# Patient Record
Sex: Female | Born: 1980 | Race: White | Hispanic: No | Marital: Married | Smoking: Former smoker
Health system: Southern US, Community
[De-identification: ages and names within clinical notes are randomized; demographics above are authoritative.]

## PROBLEM LIST (undated history)

## (undated) DIAGNOSIS — F419 Anxiety disorder, unspecified: Secondary | ICD-10-CM

## (undated) DIAGNOSIS — I10 Essential (primary) hypertension: Secondary | ICD-10-CM

## (undated) HISTORY — PX: COLON SURGERY: SHX602

## (undated) HISTORY — PX: HERNIA REPAIR: SHX51

## (undated) HISTORY — PX: CHOLECYSTECTOMY: SHX55

---

## 2004-03-09 ENCOUNTER — Emergency Department (HOSPITAL_COMMUNITY): Admission: EM | Admit: 2004-03-09 | Discharge: 2004-03-09 | Payer: Self-pay | Admitting: Emergency Medicine

## 2006-07-02 IMAGING — CR DG ABDOMEN ACUTE W/ 1V CHEST
4 series · 4 of 4 positions shown · non-contrast
Comparison: none

CLINICAL DATA: Patient has abdominal pain.  
 ABDOMINAL SERIES WITH CHEST
 Chest film reveals the heart size to be normal.  Lung fields are clear.  Supine and erect views of the abdomen reveal retained gas and feces throughout the colon with a few loops of small bowel gas.  Metallic clips in the right upper quadrant with a metallic IUD in the pelvis.  
 IMPRESSION
 Mild ileus with retained feces throughout the colon.

[view not recorded (1 of 4)]
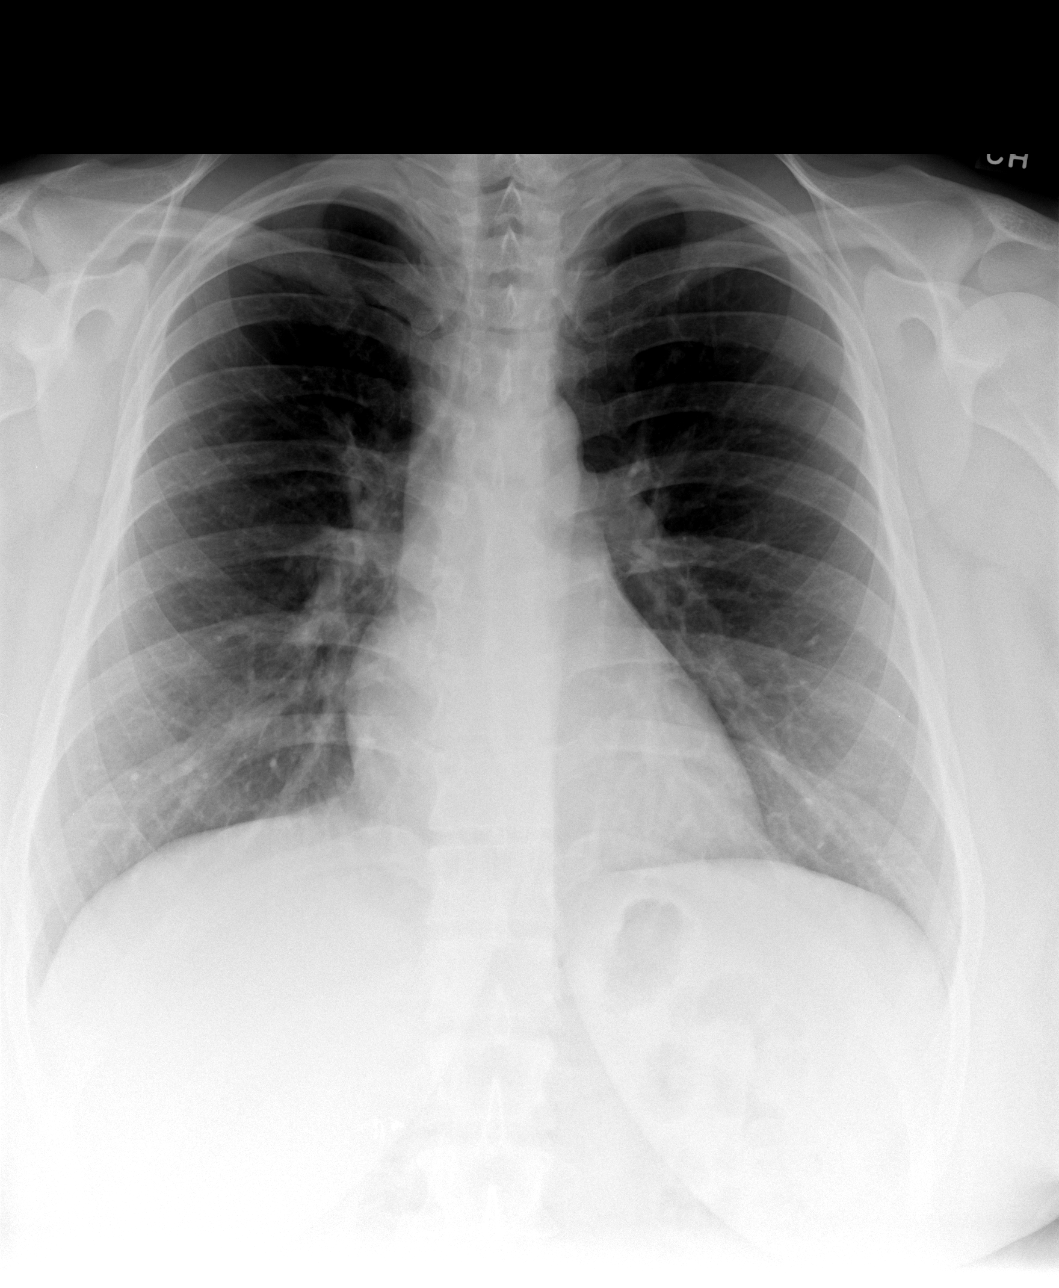

[view not recorded (2 of 4)]
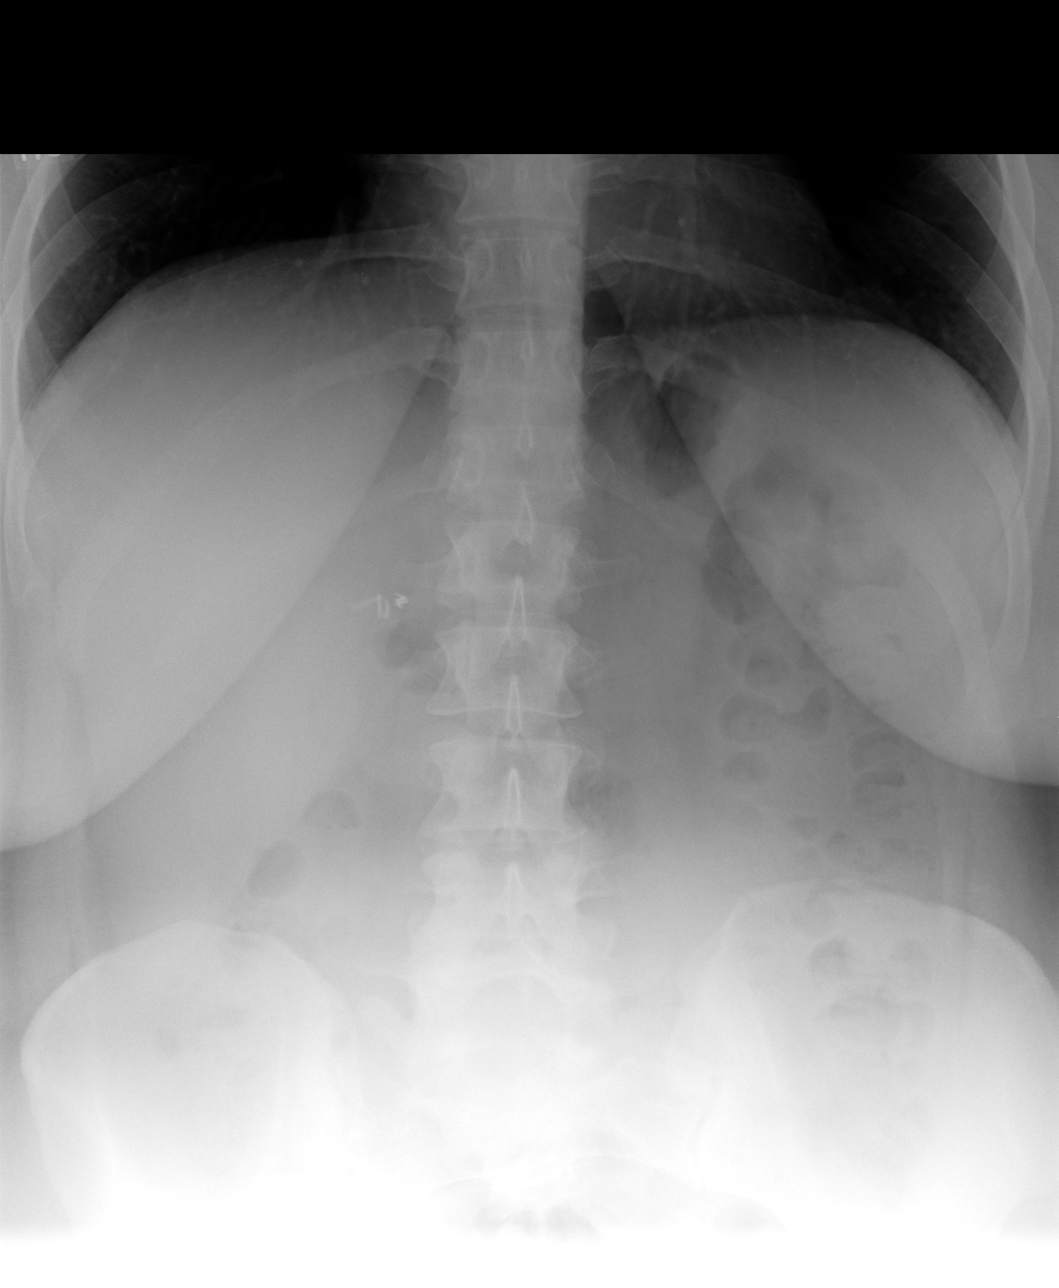

[view not recorded (3 of 4)]
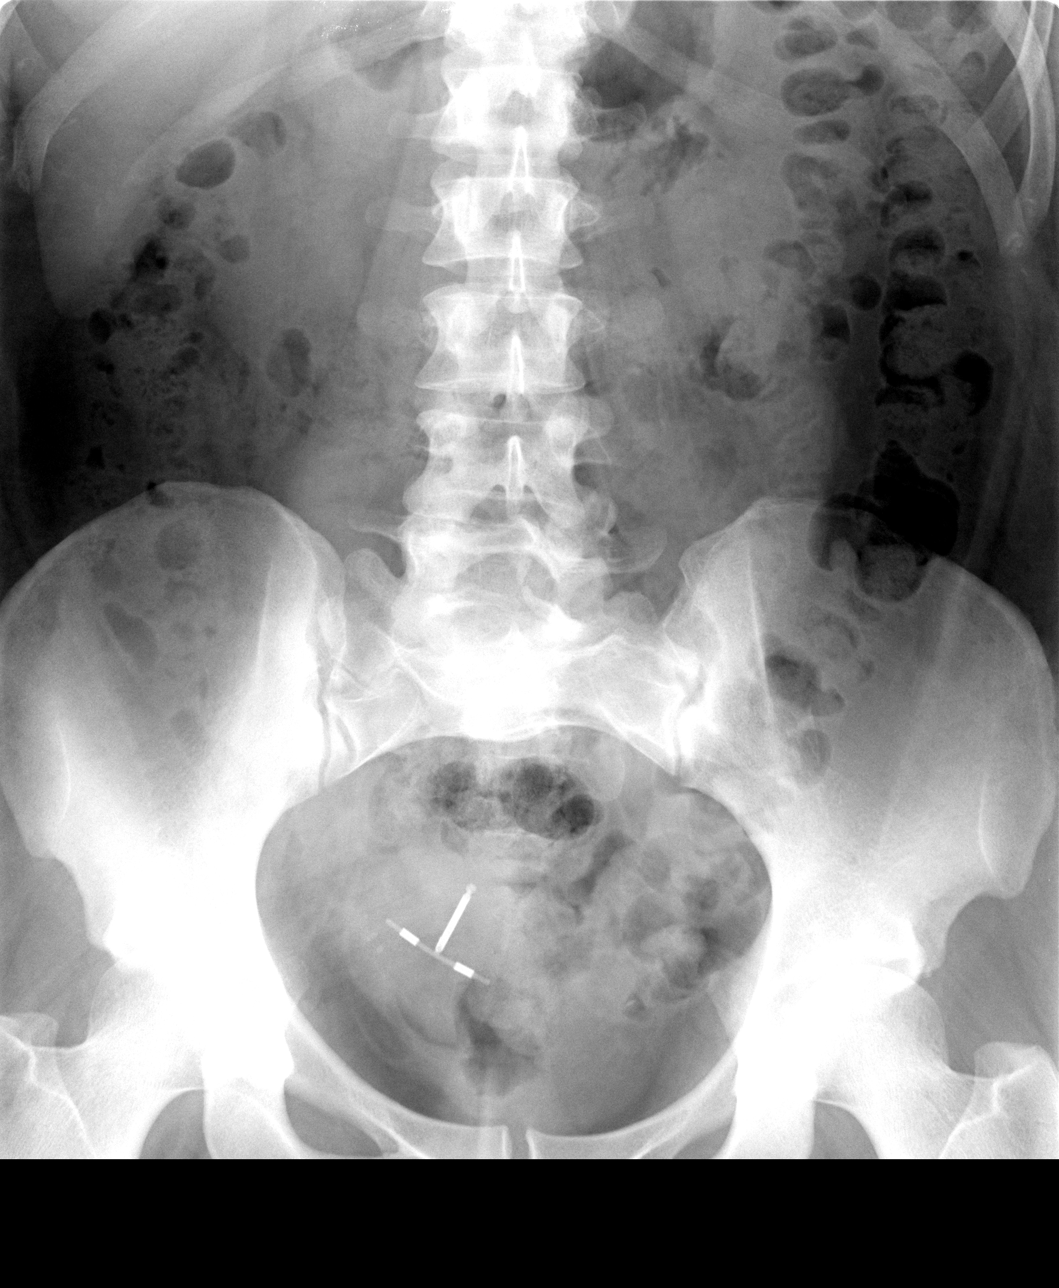

[view not recorded (4 of 4)]
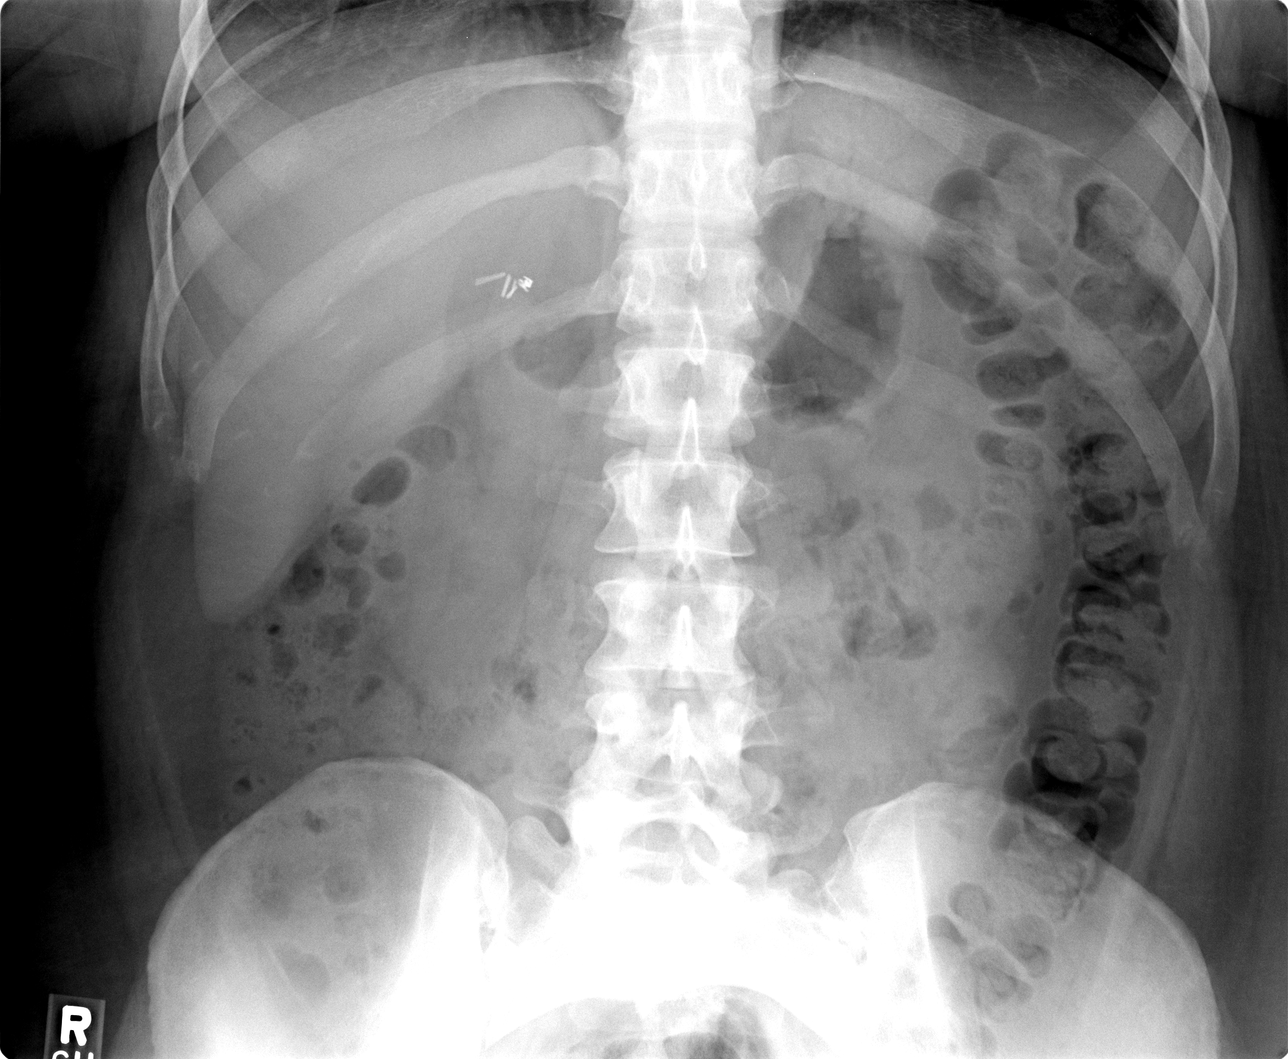

[4 of 4 positions shown; findings below may reference images not displayed]

## 2016-12-26 ENCOUNTER — Emergency Department (HOSPITAL_BASED_OUTPATIENT_CLINIC_OR_DEPARTMENT_OTHER)
Admission: EM | Admit: 2016-12-26 | Discharge: 2016-12-26 | Disposition: A | Discharge status: Home / Self Care | Payer: Self-pay | Attending: Emergency Medicine | Admitting: Emergency Medicine

## 2016-12-26 ENCOUNTER — Encounter (HOSPITAL_BASED_OUTPATIENT_CLINIC_OR_DEPARTMENT_OTHER): Payer: Self-pay

## 2016-12-26 DIAGNOSIS — Z79899 Other long term (current) drug therapy: Secondary | ICD-10-CM | POA: Insufficient documentation

## 2016-12-26 DIAGNOSIS — K625 Hemorrhage of anus and rectum: Secondary | ICD-10-CM | POA: Insufficient documentation

## 2016-12-26 DIAGNOSIS — Z87891 Personal history of nicotine dependence: Secondary | ICD-10-CM | POA: Insufficient documentation

## 2016-12-26 DIAGNOSIS — I10 Essential (primary) hypertension: Secondary | ICD-10-CM | POA: Insufficient documentation

## 2016-12-26 HISTORY — DX: Anxiety disorder, unspecified: F41.9

## 2016-12-26 HISTORY — DX: Essential (primary) hypertension: I10

## 2016-12-26 LAB — BASIC METABOLIC PANEL
Anion gap: 10 (ref 5–15)
BUN: 17 mg/dL (ref 6–20)
CO2: 22 mmol/L (ref 22–32)
Calcium: 9.2 mg/dL (ref 8.9–10.3)
Chloride: 105 mmol/L (ref 101–111)
Creatinine, Ser: 0.95 mg/dL (ref 0.44–1.00)
GFR calc Af Amer: >60 mL/min (ref >60)
Glucose, Bld: 100 mg/dL — ABNORMAL HIGH (ref 65–99)
POTASSIUM: 3.7 mmol/L (ref 3.5–5.1)
SODIUM: 137 mmol/L (ref 135–145)

## 2016-12-26 LAB — URINALYSIS, ROUTINE W REFLEX MICROSCOPIC
Bilirubin Urine: NEGATIVE
Glucose, UA: NEGATIVE mg/dL
Hgb urine dipstick: NEGATIVE
Ketones, ur: NEGATIVE mg/dL
Nitrite: NEGATIVE
Protein, ur: NEGATIVE mg/dL
Specific Gravity, Urine: 1.008 (ref 1.005–1.030)
pH: 5.5 (ref 5.0–8.0)

## 2016-12-26 LAB — CBC
HCT: 34.2 % — ABNORMAL LOW (ref 36.0–46.0)
HEMOGLOBIN: 11.3 g/dL — AB (ref 12.0–15.0)
MCH: 27.9 pg (ref 26.0–34.0)
MCHC: 33 g/dL (ref 30.0–36.0)
MCV: 84.4 fL (ref 78.0–100.0)
Platelets: 305 10*3/uL (ref 150–400)
RBC: 4.05 MIL/uL (ref 3.87–5.11)
RDW: 14.6 % (ref 11.5–15.5)
WBC: 7.8 10*3/uL (ref 4.0–10.5)

## 2016-12-26 LAB — URINALYSIS, MICROSCOPIC (REFLEX): RBC / HPF: NONE SEEN RBC/hpf (ref 0–5)

## 2016-12-26 LAB — OCCULT BLOOD X 1 CARD TO LAB, STOOL: Fecal Occult Bld: NEGATIVE

## 2016-12-26 NOTE — ED Triage Notes (Signed)
C/o bleeding after BM x 1 week-blood on tissue and in stool-NAD-steady gait

## 2016-12-26 NOTE — Discharge Instructions (Signed)
Make sure to stay well-hydrated with water. Follow-up with the stomach doctor (gastroenterologist) for further evaluation of the bleeding. Return to the emergency room if you develop fever, chills, significant pain, vomiting, or any new or worsening symptoms.

## 2016-12-26 NOTE — ED Provider Notes (Signed)
[facility] Provider Note   CSN: [id number] Arrival date & time: 12/26/16  1545     History   Chief Complaint Chief Complaint  Patient presents with  . Rectal Bleeding    HPI Sabrina Watkins is a 36 y.o. female presenting with one-week rectal bleeding.  Patient states that about a week ago, she started to notice blood streaked around her stool and on the toilet paper when she wipes. This has been happening with every bowel movement for the past week. She does not have blood in her underwear in between BMs. She denies fever, chills, nausea, vomiting, abdominal pain, weakness, pallor, or urinary symptoms. She denies dizziness, lightheadedness, or weakness. She denies numbness or tingling. She denies pain with bowel movements. She does not have a history of rectal bleeding in the past. She has had multiple abdominal surgeries including hernia repair last summer which resulted in partial small intestine resection. She has no change in appetite, and has not changed her diet recently. She has not had recent travel. No nausea is having GI symptoms. She has had no change in medicines recently.   HPI  Past Medical History:  Diagnosis Date  . Anxiety   . Hypertension     There are no active problems to display for this patient.   Past Surgical History:  Procedure Laterality Date  . CHOLECYSTECTOMY    . COLON SURGERY    . HERNIA REPAIR      OB History    No data available       Home Medications    Prior to Admission medications   Medication Sig Start Date End Date Taking? Authorizing Provider  ALPRAZolam (XANAX) 0.25 MG tablet Take 0.25 mg by mouth 2 (two) times daily as needed for anxiety.   Yes [provider]  lisinopril-hydrochlorothiazide (PRINZIDE,ZESTORETIC) 20-25 MG tablet Take 1 tablet by mouth daily.   Yes [provider]  verapamil (VERELAN PM) 240 MG 24 hr capsule Take 240 mg by mouth 2 (two) times daily.   Yes [provider]      Family History No family history on file.  Social History Social History  Substance Use Topics  . Smoking status: Former Games developer  . Smokeless tobacco: Never Used  . Alcohol use No     Allergies   Ivp dye [iodinated diagnostic agents] and Sulfa antibiotics   Review of Systems Review of Systems  Constitutional: Negative for chills and fever.  HENT: Negative for congestion and sore throat.   Eyes: Negative for photophobia and pain.  Respiratory: Negative for cough, chest tightness and shortness of breath.   Cardiovascular: Negative for chest pain.  Gastrointestinal: Positive for blood in stool. Negative for abdominal distention, abdominal pain, constipation, diarrhea, nausea, rectal pain and vomiting.  Genitourinary: Negative for dysuria, frequency, hematuria and pelvic pain.  Musculoskeletal: Negative for arthralgias and gait problem.  Skin: Negative for wound.  Neurological: Negative for dizziness, light-headedness and headaches.  Hematological: Does not bruise/bleed easily.  Psychiatric/Behavioral: Negative for confusion.     Physical Exam Updated Vital Signs BP (!) 112/57 (BP Location: Left Arm)   Pulse 97   Temp 99.6 F (37.6 C) (Oral)   Resp 20   Ht 5\' 2"  (1.575 m)   Wt 133 kg (293 lb 3.4 oz)   LMP 12/05/2016   SpO2 98%   BMI 53.63 kg/m   Physical Exam  Constitutional: She is oriented to person, place, and time. She appears well-developed and well-nourished. No distress.  HENT:  Head: Normocephalic and atraumatic.  Mouth/Throat: Mucous membranes are normal.  Eyes: Pupils are equal, round, and reactive to light. Conjunctivae and EOM are normal.  Neck: Normal range of motion.  Cardiovascular: Normal rate, regular rhythm and intact distal pulses.   Pulmonary/Chest: Effort normal and breath sounds normal. No respiratory distress. She has no wheezes.  Abdominal: Soft. Normal appearance and bowel sounds are normal. She exhibits no distension (obese, no  increased distension). There is no tenderness. There is no rigidity, no rebound, no guarding, no CVA tenderness, no tenderness at McBurney's point and negative Murphy's sign.  Genitourinary: Rectum normal. Rectal exam shows no external hemorrhoid, no internal hemorrhoid, no fissure, no mass, anal tone normal and guaiac negative stool.  Genitourinary Comments: No obvious masses or hemorrhoids. No grossly obvious blood.   Musculoskeletal: Normal range of motion.  Lymphadenopathy:    She has no cervical adenopathy.  Neurological: She is alert and oriented to person, place, and time.  Skin: Skin is warm and dry.  Nursing note and vitals reviewed.    ED Treatments / Results  Labs (all labs ordered are listed, but only abnormal results are displayed) Labs Reviewed  CBC - Abnormal; Notable for the following:       Result Value   Hemoglobin 11.3 (*)    HCT 34.2 (*)    All other components within normal limits  BASIC METABOLIC PANEL - Abnormal; Notable for the following:    Glucose, Bld 100 (*)    All other components within normal limits  URINALYSIS, ROUTINE W REFLEX MICROSCOPIC - Abnormal; Notable for the following:    Leukocytes, UA TRACE (*)    All other components within normal limits  URINALYSIS, MICROSCOPIC (REFLEX) - Abnormal; Notable for the following:    Bacteria, UA RARE (*)    Squamous Epithelial / LPF 0-5 (*)    All other components within normal limits  OCCULT BLOOD X 1 CARD TO LAB, STOOL    EKG  EKG Interpretation None       Radiology No results found.  Procedures Procedures (including critical care time)  Medications Ordered in ED Medications - No data to display   Initial Impression / Assessment and Plan / ED Course  I have reviewed the triage vital signs and the nursing notes.  Pertinent labs & imaging results that were available during my care of the patient were reviewed by me and considered in my medical decision making (see chart for details).      Pt presenting with 1 wk h/o blood in her stools. No pain, fevers, chills or blood in her underwear. Physical exam reassuring. DRE showed no obvious deformities or gross blood. Will order basic labs to check hgb and hct, wbc, and electrolytes. Hemoccult and UA ordered.  Labs reassuring. UA negative for infection, hemoccult negative. Doubt intraabdominal infection, perforation, obstruction, or concerning GI bleed. At this time, I do not think she needs abd imaging. Discussed case with attending, and Dr. Ranae Palms agrees to plan. Will have pt f/u with GI. Pt appears safe for discharge. Return precautions given. Pt states she understands and agrees to plan.   Final Clinical Impressions(s) / ED Diagnoses   Final diagnoses:  Rectal bleeding    New Prescriptions Discharge Medication List as of 12/26/2016  5:14 PM       Alveria Apley, PA-C 12/27/16 2346    Loren Racer, MD 12/28/16 1539

## 2023-01-13 ENCOUNTER — Other Ambulatory Visit: Payer: Self-pay

## 2023-01-13 ENCOUNTER — Encounter (HOSPITAL_BASED_OUTPATIENT_CLINIC_OR_DEPARTMENT_OTHER): Payer: Self-pay

## 2023-01-13 ENCOUNTER — Emergency Department (HOSPITAL_BASED_OUTPATIENT_CLINIC_OR_DEPARTMENT_OTHER)
Admission: EM | Admit: 2023-01-13 | Discharge: 2023-01-13 | Disposition: A | Discharge status: Other Acute Inpatient Hospital | Payer: BC Managed Care – PPO | Attending: Emergency Medicine | Admitting: Emergency Medicine

## 2023-01-13 ENCOUNTER — Emergency Department (HOSPITAL_BASED_OUTPATIENT_CLINIC_OR_DEPARTMENT_OTHER): Payer: BC Managed Care – PPO

## 2023-01-13 DIAGNOSIS — E876 Hypokalemia: Secondary | ICD-10-CM

## 2023-01-13 DIAGNOSIS — K562 Volvulus: Secondary | ICD-10-CM | POA: Diagnosis not present

## 2023-01-13 DIAGNOSIS — R509 Fever, unspecified: Secondary | ICD-10-CM | POA: Diagnosis present

## 2023-01-13 DIAGNOSIS — E860 Dehydration: Secondary | ICD-10-CM

## 2023-01-13 DIAGNOSIS — Z1152 Encounter for screening for COVID-19: Secondary | ICD-10-CM | POA: Diagnosis not present

## 2023-01-13 DIAGNOSIS — I10 Essential (primary) hypertension: Secondary | ICD-10-CM | POA: Insufficient documentation

## 2023-01-13 DIAGNOSIS — Z79899 Other long term (current) drug therapy: Secondary | ICD-10-CM | POA: Diagnosis not present

## 2023-01-13 LAB — MAGNESIUM: Magnesium: 1.7 mg/dL (ref 1.7–2.4)

## 2023-01-13 LAB — URINALYSIS, MICROSCOPIC (REFLEX)

## 2023-01-13 LAB — CBC WITH DIFFERENTIAL/PLATELET
Abs Immature Granulocytes: 0.06 10*3/uL (ref 0.00–0.07)
Basophils Absolute: 0 10*3/uL (ref 0.0–0.1)
Basophils Relative: 0 %
Eosinophils Absolute: 0 10*3/uL (ref 0.0–0.5)
Eosinophils Relative: 0 %
HCT: 36.8 % (ref 36.0–46.0)
Hemoglobin: 12.4 g/dL (ref 12.0–15.0)
Immature Granulocytes: 1 %
Lymphocytes Relative: 11 %
Lymphs Abs: 1.2 10*3/uL (ref 0.7–4.0)
MCH: 28 pg (ref 26.0–34.0)
MCHC: 33.7 g/dL (ref 30.0–36.0)
MCV: 83.1 fL (ref 80.0–100.0)
Monocytes Absolute: 1.2 10*3/uL — ABNORMAL HIGH (ref 0.1–1.0)
Monocytes Relative: 12 %
Neutro Abs: 8.2 10*3/uL — ABNORMAL HIGH (ref 1.7–7.7)
Neutrophils Relative %: 76 %
Platelets: 268 10*3/uL (ref 150–400)
RBC: 4.43 MIL/uL (ref 3.87–5.11)
RDW: 13.9 % (ref 11.5–15.5)
WBC: 10.8 10*3/uL — ABNORMAL HIGH (ref 4.0–10.5)
nRBC: 0 % (ref 0.0–0.2)

## 2023-01-13 LAB — COMPREHENSIVE METABOLIC PANEL
ALT: 30 U/L (ref 0–44)
AST: 32 U/L (ref 15–41)
Albumin: 3.1 g/dL — ABNORMAL LOW (ref 3.5–5.0)
Alkaline Phosphatase: 71 U/L (ref 38–126)
Anion gap: 10 (ref 5–15)
BUN: 10 mg/dL (ref 6–20)
CO2: 24 mmol/L (ref 22–32)
Calcium: 8.3 mg/dL — ABNORMAL LOW (ref 8.9–10.3)
Chloride: 99 mmol/L (ref 98–111)
Creatinine, Ser: 0.77 mg/dL (ref 0.44–1.00)
GFR, Estimated: >60 mL/min (ref >60)
Glucose, Bld: 103 mg/dL — ABNORMAL HIGH (ref 70–99)
Potassium: 2.6 mmol/L — CL (ref 3.5–5.1)
Sodium: 133 mmol/L — ABNORMAL LOW (ref 135–145)
Total Bilirubin: 0.5 mg/dL (ref 0.3–1.2)
Total Protein: 6.8 g/dL (ref 6.5–8.1)

## 2023-01-13 LAB — URINALYSIS, ROUTINE W REFLEX MICROSCOPIC
Glucose, UA: NEGATIVE mg/dL
Ketones, ur: 15 mg/dL — AB
Nitrite: NEGATIVE
Protein, ur: 100 mg/dL — AB
Specific Gravity, Urine: >=1.03 (ref 1.005–1.030)
pH: 6 (ref 5.0–8.0)

## 2023-01-13 LAB — LIPASE, BLOOD: Lipase: 25 U/L (ref 11–51)

## 2023-01-13 LAB — PREGNANCY, URINE: Preg Test, Ur: NEGATIVE

## 2023-01-13 LAB — LACTIC ACID, PLASMA: Lactic Acid, Venous: 0.8 mmol/L (ref 0.5–1.9)

## 2023-01-13 LAB — SARS CORONAVIRUS 2 BY RT PCR: SARS Coronavirus 2 by RT PCR: NEGATIVE

## 2023-01-13 MED ORDER — ONDANSETRON HCL 4 MG/2ML IJ SOLN
4.0000 mg | Freq: Once | INTRAMUSCULAR | Status: AC
  Administered 2023-01-13: 4 mg via INTRAVENOUS
  Filled 2023-01-13: qty 2

## 2023-01-13 MED ORDER — SODIUM CHLORIDE 0.9 % IV SOLN: INTRAVENOUS | Status: DC

## 2023-01-13 MED ORDER — POTASSIUM CHLORIDE 10 MEQ/100ML IV SOLN
10.0000 meq | INTRAVENOUS | Status: AC
  Administered 2023-01-13 (×2): 10 meq via INTRAVENOUS
  Filled 2023-01-13 (×2): qty 100

## 2023-01-13 MED ORDER — SODIUM CHLORIDE 0.9 % IV BOLUS
1000.0000 mL | Freq: Once | INTRAVENOUS | Status: AC
  Administered 2023-01-13: 1000 mL via INTRAVENOUS

## 2023-01-13 MED ORDER — PIPERACILLIN-TAZOBACTAM 3.375 G IVPB 30 MIN
3.3750 g | Freq: Once | INTRAVENOUS | Status: AC
  Administered 2023-01-13: 3.375 g via INTRAVENOUS
  Filled 2023-01-13: qty 50

## 2023-01-13 MED ORDER — ACETAMINOPHEN 650 MG RE SUPP
650.0000 mg | Freq: Once | RECTAL | Status: AC
  Administered 2023-01-13: 650 mg via RECTAL
  Filled 2023-01-13: qty 1

## 2023-01-13 NOTE — ED Notes (Signed)
Attempted to call report x2, both times rang through to voicemail. Will attempt later, will give bedside report to CCT.

## 2023-01-13 NOTE — ED Provider Notes (Signed)
[place] [facility] AT [facility] Provider Note   CSN: [id number] Arrival date & time: 01/13/23  4098     History  Chief Complaint  Patient presents with   Fever    Sabrina Watkins is a 42 y.o. female.  Pt is a 42 yo female with pmhx significant for htn and anxiety.  Pt has had diarrhea and fever for the past few days.  She also has upper abd pain.  No vomiting.  No nausea.  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  She is worried she has this again.       Home Medications Prior to Admission medications   Medication Sig Start Date End Date Taking? Authorizing Provider  ALPRAZolam (XANAX) 0.25 MG tablet Take 0.25 mg by mouth 2 (two) times daily as needed for anxiety.    [provider]  lisinopril-hydrochlorothiazide (PRINZIDE,ZESTORETIC) 20-25 MG tablet Take 1 tablet by mouth daily.    [provider]  verapamil (VERELAN PM) 240 MG 24 hr capsule Take 240 mg by mouth 2 (two) times daily.    [provider]      Allergies    Ivp dye [iodinated contrast media] and Sulfa antibiotics    Review of Systems   Review of Systems  Gastrointestinal:  Positive for abdominal pain and nausea.  All other systems reviewed and are negative.   Physical Exam Updated Vital Signs BP (!) 114/52 (BP Location: Right Arm)   Pulse 88   Temp (!) 101.8 F (38.8 C) (Oral)   Resp 17   Ht 5\' 3"  (1.6 m)   Wt 133.8 kg   SpO2 97%   BMI 52.26 kg/m  Physical Exam Vitals and nursing note reviewed.  Constitutional:      Appearance: Normal appearance. She is obese.  HENT:     Head: Normocephalic and atraumatic.     Right Ear: External ear normal.     Left Ear: External ear normal.     Nose: Nose normal.     Mouth/Throat:     Mouth: Mucous membranes are dry.  Eyes:     Extraocular Movements: Extraocular movements intact.     Conjunctiva/sclera: Conjunctivae normal.     Pupils: Pupils are equal,  round, and reactive to light.  Cardiovascular:     Rate and Rhythm: Regular rhythm. Tachycardia present.     Pulses: Normal pulses.     Heart sounds: Normal heart sounds.  Pulmonary:     Effort: Pulmonary effort is normal.     Breath sounds: Normal breath sounds.  Abdominal:     General: Abdomen is flat. Bowel sounds are normal.     Palpations: Abdomen is soft.     Tenderness: There is generalized abdominal tenderness.  Musculoskeletal:        General: Normal range of motion.     Cervical back: Normal range of motion and neck supple.  Skin:    General: Skin is warm.     Capillary Refill: Capillary refill takes less than 2 seconds.  Neurological:     General: No focal deficit present.     Mental Status: She is alert and oriented to person, place, and time.  Psychiatric:        Mood and Affect: Mood normal.        Behavior: Behavior normal.     ED Results / Procedures / Treatments   Labs (all labs ordered are listed, but only abnormal  results are displayed) Labs Reviewed  CBC WITH DIFFERENTIAL/PLATELET - Abnormal; Notable for the following components:      Result Value   WBC 10.8 (*)    Neutro Abs 8.2 (*)    Monocytes Absolute 1.2 (*)    All other components within normal limits  COMPREHENSIVE METABOLIC PANEL - Abnormal; Notable for the following components:   Sodium 133 (*)    Potassium 2.6 (*)    Glucose, Bld 103 (*)    Calcium 8.3 (*)    Albumin 3.1 (*)    All other components within normal limits  URINALYSIS, ROUTINE W REFLEX MICROSCOPIC - Abnormal; Notable for the following components:   Color, Urine AMBER (*)    APPearance HAZY (*)    Hgb urine dipstick TRACE (*)    Bilirubin Urine MODERATE (*)    Ketones, ur 15 (*)    Protein, ur 100 (*)    Leukocytes,Ua TRACE (*)    All other components within normal limits  URINALYSIS, MICROSCOPIC (REFLEX) - Abnormal; Notable for the following components:   Bacteria, UA FEW (*)    All other components within normal limits   SARS CORONAVIRUS 2 BY RT PCR  LIPASE, BLOOD  PREGNANCY, URINE  MAGNESIUM  LACTIC ACID, PLASMA  LACTIC ACID, PLASMA    EKG None  Radiology CT ABDOMEN PELVIS WO CONTRAST  Result Date: 01/13/2023 CLINICAL DATA:  Abdominal pain and diarrhea for 5 days.  Fever. EXAM: CT ABDOMEN AND PELVIS WITHOUT CONTRAST TECHNIQUE: Multidetector CT imaging of the abdomen and pelvis was performed following the standard protocol without IV contrast. RADIATION DOSE REDUCTION: This exam was performed according to the departmental dose-optimization program which includes automated exposure control, adjustment of the mA and/or kV according to patient size and/or use of iterative reconstruction technique. COMPARISON:  03/23/2016 FINDINGS: Lower chest: No acute findings. Hepatobiliary: No mass visualized on this unenhanced exam. Prior cholecystectomy. No evidence of biliary obstruction. Pancreas: No mass or inflammatory process visualized on this unenhanced exam. Spleen:  Within normal limits in size. Adrenals/Urinary tract: No evidence of urolithiasis or hydronephrosis. Unremarkable unopacified urinary bladder. Stomach/Bowel: Small hiatal hernia noted. No evidence of bowel obstruction. Small bowel anastomosis seen in the anterior lower abdomen. A small right lower quadrant abdominal wall hernia is seen which contains a loop of small bowel. Bowel wall thickening is seen within the hernia as well as surrounding inflammatory changes and adjacent extraluminal air bubbles. This is highly suspicious for strangulated hernia, with bowel ischemia and perforation. A midline paraumbilical hernia is also seen containing a loop of small bowel. No No evidence of obstruction or strangulation at this site. Diverticulosis is seen mainly involving the descending and sigmoid colon, however there is no evidence of diverticulitis. Vascular/Lymphatic: No pathologically enlarged lymph nodes identified. No evidence of abdominal aortic aneurysm.  Reproductive: IUD again seen in appropriate position. A right fundal uterine fibroid is seen measuring 5.4 cm. Adnexal regions are unremarkable. Other:  None. Musculoskeletal:  No suspicious bone lesions identified. IMPRESSION: Small right lower quadrant abdominal wall hernia containing a loop of small bowel, with findings highly suspicious for strangulation with bowel ischemia and perforation. No No evidence of small-bowel obstruction. Midline paraumbilical hernia containing a small bowel loop. No evidence of obstruction or strangulation at this site. Colonic diverticulosis, without radiographic evidence of diverticulitis. 5.4 cm uterine fibroid. Small hiatal hernia. Critical Value/emergent results were called by telephone at the time of interpretation on 01/13/2023 at 12:51 pm to provider Rubel Heckard , who verbally acknowledged  these results. Electronically Signed   By: Danae Orleans M.D.   On: 01/13/2023 13:03    Procedures Procedures    Medications Ordered in ED Medications  potassium chloride 10 mEq in 100 mL IVPB (10 mEq Intravenous New Bag/Given 01/13/23 1321)  0.9 %  sodium chloride infusion ( Intravenous New Bag/Given 01/13/23 1142)  sodium chloride 0.9 % bolus 1,000 mL (0 mLs Intravenous Stopped 01/13/23 1120)  ondansetron (ZOFRAN) injection 4 mg (4 mg Intravenous Given 01/13/23 1049)  piperacillin-tazobactam (ZOSYN) IVPB 3.375 g (3.375 g Intravenous New Bag/Given 01/13/23 1318)    ED Course/ Medical Decision Making/ A&P                                 Medical Decision Making Amount and/or Complexity of Data Reviewed Labs: ordered. Radiology: ordered.  Risk Prescription drug management.   This patient presents to the ED for concern of abd pain, this involves an extensive number of treatment options, and is a complaint that carries with it a high risk of complications and morbidity.  The differential diagnosis includes uti, pregnancy, covid, pancreatitis   Co morbidities that complicate  the patient evaluation  Htn and anxiety   Additional history obtained:  Additional history obtained from epic chart review  Lab Tests:  I Ordered, and personally interpreted labs.  The pertinent results include:  cbc with wbc 10.8 otherwise nl, ua + ketones and protein; no uti; cmp with k low at 2.6, mg nl at 1.7, covid neg   Imaging Studies ordered:  I ordered imaging studies including ct abd/pelvis  I independently visualized and interpreted imaging which showed  Small right lower quadrant abdominal wall hernia containing a loop  of small bowel, with findings highly suspicious for strangulation  with bowel ischemia and perforation.    No No evidence of small-bowel obstruction.    Midline paraumbilical hernia containing a small bowel loop. No  evidence of obstruction or strangulation at this site.    Colonic diverticulosis, without radiographic evidence of  diverticulitis.    5.4 cm uterine fibroid.    Small hiatal hernia.   I agree with the radiologist interpretation   Cardiac Monitoring:  The patient was maintained on a cardiac monitor.  I personally viewed and interpreted the cardiac monitored which showed an underlying rhythm of: st   Medicines ordered and prescription drug management:  I ordered medication including ivfs/zofran  for sx  Reevaluation of the patient after these medicines showed that the patient improved I have reviewed the patients home medicines and have made adjustments as needed   Test Considered:  ct   Critical Interventions:  ivfs   Consultations Obtained:  I requested consultation with the surgeon (Dr. Earlie Counts) at [place],  and discussed lab and imaging findings as well as pertinent plan - he accepted pt for transfer   Problem List / ED Course:  Hernia with loop of bowel suspicious for strangulation with bowel ischemia and perforation:  pt given ivfs and zofran.  She did not want any narcotic pain medication.  She is  feeling better after fluids.  She is given iv zosyn.  She requests tx to [facility] as she had her surgery done there.  She has been accepted for transfer.  She is stable for transfer.  Hypokalemia:  pt given IV kcl times 2  Dehydration:  pt given IVFs   Reevaluation:  After the interventions noted above,  I reevaluated the patient and found that they have :improved   Social Determinants of Health:  Lives at home   Dispostion:  After consideration of the diagnostic results and the patients response to treatment, I feel that the patent would benefit from transfer to [facility].    CRITICAL CARE Performed by: Jacalyn Lefevre   Total critical care time: 30 minutes  Critical care time was exclusive of separately billable procedures and treating other patients.  Critical care was necessary to treat or prevent imminent or life-threatening deterioration.  Critical care was time spent personally by me on the following activities: development of treatment plan with patient and/or surrogate as well as nursing, discussions with consultants, evaluation of patient's response to treatment, examination of patient, obtaining history from patient or surrogate, ordering and performing treatments and interventions, ordering and review of laboratory studies, ordering and review of radiographic studies, pulse oximetry and re-evaluation of patient's condition.         Final Clinical Impression(s) / ED Diagnoses Final diagnoses:  Dehydration  Hypokalemia  Small bowel strangulation [facility])    Rx / DC Orders ED Discharge Orders     None         Jacalyn Lefevre, MD 01/13/23 1419

## 2023-01-13 NOTE — ED Notes (Signed)
Got off the phone with [facility] dispatch. Pt going to [phone]. Call for report: [phone]

## 2023-01-13 NOTE — ED Triage Notes (Signed)
Patient here POV from Home.  Endorses Diarrhea that began Wednesday and then subsided rather quickly. Has since had intermittent fevers (Highest 101.9) since. Notes some Mid to Upper ABD Pain. Pain is mostly consistent.   Some nausea. No Emesis. 1 more episode of diarrhea yesterday.   NAD Noted during Triage. A&Ox4, GCS 15. Ambulatory.

## 2023-01-13 NOTE — ED Notes (Signed)
Spoke with Marylene Land at [facility] regarding consult.

## 2023-01-13 NOTE — ED Notes (Signed)
Spoke with Zella Ball at [facility]  regarding general surgery consult

## 2023-01-13 NOTE — ED Notes (Signed)
Patient transported to CT
